# Patient Record
Sex: Female | Born: 1975 | Race: White | Hispanic: Yes | Marital: Married | Smoking: Current every day smoker
Health system: Southern US, Community
[De-identification: ages and names within clinical notes are randomized; demographics above are authoritative.]

## PROBLEM LIST (undated history)

## (undated) DIAGNOSIS — J45909 Unspecified asthma, uncomplicated: Secondary | ICD-10-CM

---

## 2007-03-13 ENCOUNTER — Emergency Department (HOSPITAL_COMMUNITY): Admission: EM | Admit: 2007-03-13 | Discharge: 2007-03-13 | Payer: Self-pay | Admitting: Emergency Medicine

## 2008-02-28 ENCOUNTER — Emergency Department (HOSPITAL_COMMUNITY): Admission: EM | Admit: 2008-02-28 | Discharge: 2008-02-28 | Payer: Self-pay | Admitting: Emergency Medicine

## 2010-05-12 ENCOUNTER — Emergency Department (HOSPITAL_COMMUNITY): Admission: EM | Admit: 2010-05-12 | Discharge: 2010-05-12 | Payer: Self-pay | Admitting: Emergency Medicine

## 2011-05-16 ENCOUNTER — Inpatient Hospital Stay (INDEPENDENT_AMBULATORY_CARE_PROVIDER_SITE_OTHER)
Admission: RE | Admit: 2011-05-16 | Discharge: 2011-05-16 | Disposition: A | Discharge status: Home / Self Care | Payer: Self-pay | Source: Ambulatory Visit | Attending: Family Medicine | Admitting: Family Medicine

## 2011-05-16 ENCOUNTER — Ambulatory Visit (INDEPENDENT_AMBULATORY_CARE_PROVIDER_SITE_OTHER): Payer: Self-pay

## 2011-05-16 DIAGNOSIS — R071 Chest pain on breathing: Secondary | ICD-10-CM

## 2011-05-16 DIAGNOSIS — R197 Diarrhea, unspecified: Secondary | ICD-10-CM

## 2011-05-16 LAB — POCT URINALYSIS DIP (DEVICE)
Ketones, ur: NEGATIVE mg/dL
Leukocytes, UA: NEGATIVE
Nitrite: NEGATIVE
Protein, ur: 30 mg/dL — AB
Urobilinogen, UA: 0.2 mg/dL (ref 0.0–1.0)
pH: 7 (ref 5.0–8.0)

## 2011-12-01 ENCOUNTER — Emergency Department (INDEPENDENT_AMBULATORY_CARE_PROVIDER_SITE_OTHER)
Admission: EM | Admit: 2011-12-01 | Discharge: 2011-12-01 | Disposition: A | Discharge status: Home / Self Care | Payer: Self-pay | Source: Home / Self Care | Attending: Emergency Medicine | Admitting: Emergency Medicine

## 2011-12-01 ENCOUNTER — Encounter (HOSPITAL_COMMUNITY): Payer: Self-pay

## 2011-12-01 DIAGNOSIS — K047 Periapical abscess without sinus: Secondary | ICD-10-CM

## 2011-12-01 DIAGNOSIS — K044 Acute apical periodontitis of pulpal origin: Secondary | ICD-10-CM

## 2011-12-01 MED ORDER — LIDOCAINE VISCOUS 2 % MT SOLN: 10.0000 mL | OROMUCOSAL | Status: AC | PRN

## 2011-12-01 MED ORDER — PENICILLIN V POTASSIUM 500 MG PO TABS: 500.0000 mg | ORAL_TABLET | Freq: Four times a day (QID) | ORAL | Status: AC

## 2011-12-01 MED ORDER — IBUPROFEN 600 MG PO TABS: 600.0000 mg | ORAL_TABLET | Freq: Four times a day (QID) | ORAL | Status: AC | PRN

## 2011-12-01 MED ORDER — CHLORHEXIDINE GLUCONATE 0.12 % MT SOLN: OROMUCOSAL | Status: AC

## 2011-12-01 MED ORDER — HYDROCODONE-ACETAMINOPHEN 5-325 MG PO TABS: ORAL_TABLET | ORAL | Status: AC

## 2011-12-01 NOTE — ED Notes (Signed)
States she has bad teeth upper area and needs rot canals; pain and swelling since Friday, worse since this AM; no relief w OTC

## 2011-12-01 NOTE — ED Provider Notes (Addendum)
History     CSN: [id number]  Arrival date & time 12/01/11  0930   First MD Initiated Contact with Patient 12/01/11 1117      Chief Complaint  Patient presents with  . Facial Swelling    (Consider location/radiation/quality/duration/timing/severity/associated sxs/prior treatment) HPI Comments: Patient with 2 days of left upper dental pain. States she has been told that she needs a root canal in the past, but cannot afford to have this done. Has been doing salt water gargles, taking 600 mg of ibuprofen 3 times a day with temporary relief. Notes left-sided facial swellings, nasal congestion, left-sided headache starting today. no nausea, vomiting, fevers. Does not recall any recent oral trauma  Patient is a 36 y.o. female presenting with tooth pain.  Dental PainThe primary symptoms include mouth pain. The symptoms began 2 days ago. The symptoms are worsening. The symptoms occur constantly.  Additional symptoms include: dental sensitivity to temperature, gum swelling, gum tenderness and facial swelling. Additional symptoms do not include: purulent gums, trismus, jaw pain, trouble swallowing, pain with swallowing, drooling, ear pain and swollen glands. Medical issues include: smoking and periodontal disease.    History reviewed. No pertinent past medical history.  History reviewed. No pertinent past surgical history.  History reviewed. No pertinent family history.  History  Substance Use Topics  . Smoking status: Current Everyday Smoker  . Smokeless tobacco: Not on file  . Alcohol Use: No    OB History    Grav Para Term Preterm Abortions TAB SAB Ect Mult Living                  Review of Systems  HENT: Positive for facial swelling. Negative for ear pain, drooling and trouble swallowing.     Allergies  Review of patient's allergies indicates no known allergies.  Home Medications   Current Outpatient Rx  Name Route Sig Dispense Refill  . CHLORHEXIDINE GLUCONATE 0.12 % MT  SOLN  15 mL swish and spit bid 120 mL 0  . HYDROCODONE-ACETAMINOPHEN 5-325 MG PO TABS  1-2 tabs q 6hr prn pain 20 tablet 0  . IBUPROFEN 600 MG PO TABS Oral Take 1 tablet (600 mg total) by mouth every 6 (six) hours as needed for pain. 30 tablet 0  . LIDOCAINE VISCOUS 2 % MT SOLN Oral Take 10 mLs by mouth as needed for pain. Hold in mouth and spit. Do not swallow. 100 mL 0  . PENICILLIN V POTASSIUM 500 MG PO TABS Oral Take 1 tablet (500 mg total) by mouth 4 (four) times daily. X 10 days 40 tablet 0    BP 107/62  Pulse 64  Temp(Src) 98.4 F (36.9 C) (Oral)  Resp 14  SpO2 100%  LMP 11/23/2011  Physical Exam  Nursing note and vitals reviewed. Constitutional: She is oriented to person, place, and time. She appears well-developed and well-nourished. No distress.       Appears uncomfortable  HENT:  Head: Normocephalic and atraumatic. No trismus in the jaw.  Mouth/Throat: Dental caries present.         Red areas= missing teeth with roots exposed. Widespread dental decay. Mild left-sided facial swelling. No loss of nasolabial fold site. No facial redness. No nasal congestion.   Eyes: Conjunctivae and EOM are normal.  Neck: Normal range of motion.  Cardiovascular: Normal rate.   Pulmonary/Chest: Effort normal.  Abdominal: She exhibits no distension.  Musculoskeletal: Normal range of motion.  Neurological: She is alert and oriented to person, place, and time.  Skin: Skin is warm and dry.  Psychiatric: She has a normal mood and affect. Her behavior is normal. Judgment and thought content normal.    ED Course  Procedures (including critical care time)  Labs Reviewed - No data to display No results found.   1. Dental infection      MDM  1120 went to evaluate pt, pt not in room.   Patient with early gingival abscess. Nothing to I&D at this time. No evidence of periapical abscess, or canine space abscess at this time. Sending home with antibiotics, chlorhexidine mouthwash, pain  medication. No evidence of Advised needs followup with open fracture or dentistry for the next 48 hours.  Luiz Blare, MD 12/01/11 1217  Luiz Blare, MD 12/01/11 [phone]

## 2014-04-21 ENCOUNTER — Emergency Department (INDEPENDENT_AMBULATORY_CARE_PROVIDER_SITE_OTHER)
Admission: EM | Admit: 2014-04-21 | Discharge: 2014-04-21 | Disposition: A | Discharge status: Home / Self Care | Payer: Self-pay | Source: Home / Self Care

## 2014-04-21 ENCOUNTER — Emergency Department (HOSPITAL_COMMUNITY): Payer: Self-pay

## 2014-04-21 ENCOUNTER — Encounter (HOSPITAL_COMMUNITY): Payer: Self-pay | Admitting: Emergency Medicine

## 2014-04-21 ENCOUNTER — Emergency Department (HOSPITAL_COMMUNITY)
Admission: EM | Admit: 2014-04-21 | Discharge: 2014-04-21 | Disposition: A | Discharge status: Home / Self Care | Payer: Self-pay | Attending: Emergency Medicine | Admitting: Emergency Medicine

## 2014-04-21 DIAGNOSIS — R0609 Other forms of dyspnea: Secondary | ICD-10-CM

## 2014-04-21 DIAGNOSIS — R079 Chest pain, unspecified: Secondary | ICD-10-CM

## 2014-04-21 DIAGNOSIS — R0600 Dyspnea, unspecified: Secondary | ICD-10-CM

## 2014-04-21 DIAGNOSIS — R0989 Other specified symptoms and signs involving the circulatory and respiratory systems: Secondary | ICD-10-CM

## 2014-04-21 DIAGNOSIS — J45909 Unspecified asthma, uncomplicated: Secondary | ICD-10-CM | POA: Insufficient documentation

## 2014-04-21 DIAGNOSIS — R002 Palpitations: Secondary | ICD-10-CM

## 2014-04-21 DIAGNOSIS — R1011 Right upper quadrant pain: Secondary | ICD-10-CM | POA: Insufficient documentation

## 2014-04-21 DIAGNOSIS — F17200 Nicotine dependence, unspecified, uncomplicated: Secondary | ICD-10-CM | POA: Insufficient documentation

## 2014-04-21 HISTORY — DX: Unspecified asthma, uncomplicated: J45.909

## 2014-04-21 LAB — CBC
HCT: 40.7 % (ref 36.0–46.0)
HEMOGLOBIN: 13.6 g/dL (ref 12.0–15.0)
MCH: 27.2 pg (ref 26.0–34.0)
MCHC: 33.4 g/dL (ref 30.0–36.0)
MCV: 81.4 fL (ref 78.0–100.0)
PLATELETS: 268 10*3/uL (ref 150–400)
RBC: 5 MIL/uL (ref 3.87–5.11)
RDW: 13.2 % (ref 11.5–15.5)
WBC: 11.3 10*3/uL — ABNORMAL HIGH (ref 4.0–10.5)

## 2014-04-21 LAB — HEPATIC FUNCTION PANEL
ALBUMIN: 3.9 g/dL (ref 3.5–5.2)
ALK PHOS: 57 U/L (ref 39–117)
ALT: 15 U/L (ref 0–35)
AST: 18 U/L (ref 0–37)
Bilirubin, Direct: <0.2 mg/dL (ref 0.0–0.3)
TOTAL PROTEIN: 6.7 g/dL (ref 6.0–8.3)
Total Bilirubin: <0.2 mg/dL — ABNORMAL LOW (ref 0.3–1.2)

## 2014-04-21 LAB — BASIC METABOLIC PANEL
ANION GAP: 11 (ref 5–15)
BUN: 7 mg/dL (ref 6–23)
CHLORIDE: 105 meq/L (ref 96–112)
CO2: 24 mEq/L (ref 19–32)
CREATININE: 0.74 mg/dL (ref 0.50–1.10)
Calcium: 8.7 mg/dL (ref 8.4–10.5)
Glucose, Bld: 88 mg/dL (ref 70–99)
POTASSIUM: 5 meq/L (ref 3.7–5.3)
Sodium: 140 mEq/L (ref 137–147)

## 2014-04-21 LAB — I-STAT TROPONIN, ED
TROPONIN I, POC: 0 ng/mL (ref 0.00–0.08)
TROPONIN I, POC: 0 ng/mL (ref 0.00–0.08)

## 2014-04-21 LAB — D-DIMER, QUANTITATIVE: D-Dimer, Quant: <0.27 ug/mL-FEU (ref 0.00–0.48)

## 2014-04-21 LAB — LIPASE, BLOOD: LIPASE: 25 U/L (ref 11–59)

## 2014-04-21 MED ORDER — ASPIRIN 81 MG PO CHEW: 324.0000 mg | CHEWABLE_TABLET | Freq: Once | ORAL | Status: DC

## 2014-04-21 MED ORDER — ASPIRIN 81 MG PO CHEW
CHEWABLE_TABLET | ORAL | Status: AC
  Filled 2014-04-21: qty 4

## 2014-04-21 MED ORDER — HYDROCODONE-ACETAMINOPHEN 5-325 MG PO TABS: 1.0000 | ORAL_TABLET | ORAL | Status: AC | PRN

## 2014-04-21 MED ORDER — SODIUM CHLORIDE 0.9 % IV SOLN
Freq: Once | INTRAVENOUS | Status: AC
  Administered 2014-04-21: 18:00:00 via INTRAVENOUS

## 2014-04-21 MED ORDER — HYDROCODONE-ACETAMINOPHEN 5-325 MG PO TABS
1.0000 | ORAL_TABLET | Freq: Once | ORAL | Status: AC
  Administered 2014-04-21: 1 via ORAL
  Filled 2014-04-21: qty 1

## 2014-04-21 NOTE — ED Notes (Signed)
Pt c/o sob and chest pain since yesterday.  States that my heart is racing off and on.  Feels anxious. States no hx of anxiety or panic attacks.   Symptoms present since yesterday

## 2014-04-21 NOTE — ED Provider Notes (Signed)
CSN: [id number]     Arrival date & time 04/21/14  1718 History   First MD Initiated Contact with Patient 04/21/14 1745     Chief Complaint  Patient presents with  . Shortness of Breath  . Chest Pain   (Consider location/radiation/quality/duration/timing/severity/associated sxs/prior Treatment) HPI Comments: 38 year old female with a greater than 20 pack year history of smoking presents with a complaint of anterior mid and left chest pain, rapid heart rate, palpitations, dyspnea , shaking and weakness. She had an episode yesterday morning and then again today. She describes the anterior chest pain as heaviness on her chest. Sometimes the pain radiates to the left shoulder. She states at times she is unable to take a deep breath and get enough air. On arrival she is in no acute distress, appears mildly anxious but no observed respiratory distress. She is speaking in complete sentences with normal content. Family history of sudden death due to pulmonary embolus and MI in younger than 39 years old.   Past Medical History  Diagnosis Date  . Asthma    History reviewed. No pertinent past surgical history. History reviewed. No pertinent family history. History  Substance Use Topics  . Smoking status: Current Every Day Smoker  . Smokeless tobacco: Not on file  . Alcohol Use: No   OB History   Grav Para Term Preterm Abortions TAB SAB Ect Mult Living                 Review of Systems  Constitutional: Positive for activity change. Negative for fever and fatigue.  HENT: Negative.   Respiratory: Positive for chest tightness and shortness of breath. Negative for wheezing.   Cardiovascular: Positive for chest pain and palpitations. Negative for leg swelling.  Gastrointestinal: Negative.   Genitourinary: Negative.   Skin: Negative.   Neurological: Negative for dizziness, syncope, facial asymmetry, speech difficulty, numbness and headaches.    Allergies  Review of patient's allergies  indicates no known allergies.  Home Medications   Prior to Admission medications   Not on File   BP 128/88  Pulse 76  Temp(Src) 98.6 F (37 C) (Oral)  Resp 16  SpO2 100%  LMP 04/07/2014 Physical Exam  Nursing note and vitals reviewed. Constitutional: She is oriented to person, place, and time. She appears well-developed and well-nourished. No distress.  Neck: Normal range of motion. Neck supple.  Cardiovascular: Normal rate, regular rhythm, normal heart sounds and intact distal pulses.   No murmur heard. Pulmonary/Chest: Effort normal and breath sounds normal.  Diminished breath sounds on expiration and prolonged expiratory phase. No crackles or wheezing heard.  Abdominal: Soft. There is no tenderness.  Musculoskeletal: She exhibits no edema and no tenderness.  Lymphadenopathy:    She has no cervical adenopathy.  Neurological: She is alert and oriented to person, place, and time.  Skin: Skin is warm and dry.  Psychiatric: She has a normal mood and affect.    ED Course  Procedures (including critical care time) Labs Review Labs Reviewed - No data to display  Imaging Review No results found. EKG: Normal sinus rhythm. No ectopy. No ST-T changes.  MDM   1. Chest pain, unspecified chest pain type   2. Dyspnea   3. Heart palpitations     [facility] for evaluation of chest pain, dyspnea and sensation of rapid heart rate and palpitations. The patient is a smoker with a family history of early heart disease and pulmonary embolism in ages less than 39  years old.     Hayden Rasmussenavid Loui Massenburg, NP 04/21/14 [phone]

## 2014-04-21 NOTE — Discharge Instructions (Signed)

## 2014-04-21 NOTE — ED Notes (Signed)
Per [facility] pt c/o 7/10 central-left chest heaviness x1 day. And SOB. Has hx of asthma. Has had 324 ASA.

## 2014-04-21 NOTE — ED Notes (Signed)
EDP at bedside  

## 2014-04-21 NOTE — ED Provider Notes (Signed)
CSN: [id number]     Arrival date & time 04/21/14  1847 History   First MD Initiated Contact with Patient 04/21/14 1856     Chief Complaint  Patient presents with  . Chest Pain     (Consider location/radiation/quality/duration/timing/severity/associated sxs/prior Treatment) HPI 38 year old female with chest pain since yesterday. Worse with certain positions. Pleuritic. Also RUQ abdominal pain during this time. Pain seems to come and go. Has had palpitations during this time as well. No dizziness. No vomiting. No hx of DVT/PE but has family history of clots and family is being worked up for familial clotting disorders.   Past Medical History  Diagnosis Date  . Asthma    History reviewed. No pertinent past surgical history. No family history on file. History  Substance Use Topics  . Smoking status: Current Every Day Smoker  . Smokeless tobacco: Not on file  . Alcohol Use: No   OB History   Grav Para Term Preterm Abortions TAB SAB Ect Mult Living                 Review of Systems  Constitutional: Negative for fever.  Respiratory: Positive for shortness of breath.   Cardiovascular: Positive for chest pain.  Gastrointestinal: Positive for abdominal pain. Negative for vomiting.  Neurological: Negative for dizziness and weakness.  All other systems reviewed and are negative.     Allergies  Review of patient's allergies indicates no known allergies.  Home Medications   Prior to Admission medications   Medication Sig Start Date End Date Taking? Authorizing Provider  ibuprofen (ADVIL,MOTRIN) 200 MG tablet Take 200 mg by mouth every 6 (six) hours as needed.   Yes Historical Provider, MD   BP 108/59  Pulse 60  Temp(Src) 98 F (36.7 C) (Oral)  Resp 16  Ht 5\' 3"  (1.6 m)  Wt 135 lb (61.236 kg)  BMI 23.92 kg/m2  SpO2 100%  LMP 04/07/2014 Physical Exam  Nursing note and vitals reviewed. Constitutional: She is oriented to person, place, and time. She appears well-developed  and well-nourished. No distress.  HENT:  Head: Normocephalic and atraumatic.  Right Ear: External ear normal.  Left Ear: External ear normal.  Nose: Nose normal.  Eyes: Right eye exhibits no discharge. Left eye exhibits no discharge.  Cardiovascular: Normal rate, regular rhythm and normal heart sounds.   Pulmonary/Chest: Effort normal and breath sounds normal. She exhibits tenderness.  Abdominal: Soft. There is tenderness (mild) in the right upper quadrant.  Musculoskeletal: She exhibits no edema.  Neurological: She is alert and oriented to person, place, and time.  Skin: Skin is warm and dry.    ED Course  Procedures (including critical care time) Labs Review Labs Reviewed  CBC - Abnormal; Notable for the following:    WBC 11.3 (*)    All other components within normal limits  HEPATIC FUNCTION PANEL - Abnormal; Notable for the following:    Total Bilirubin <0.2 (*)    All other components within normal limits  BASIC METABOLIC PANEL  LIPASE, BLOOD  D-DIMER, QUANTITATIVE  I-STAT TROPOININ, ED  Rosezena SensorI-STAT TROPOININ, ED    Imaging Review Dg Chest 2 View  04/21/2014   CLINICAL DATA:  Shortness of breath, chest pain  EXAM: CHEST  2 VIEW  COMPARISON:  None.  FINDINGS: The heart size and mediastinal contours are within normal limits. Both lungs are clear. The visualized skeletal structures are unremarkable.  IMPRESSION: No active cardiopulmonary disease.   Electronically Signed   By: Caryn BeeKevin  Dover M.D.   On: 04/21/2014 20:24   [place] Abdomen Limited  04/21/2014   CLINICAL DATA:  Right upper quadrant abdominal pain. Patient ate crackers approximately 2 hr prior to the examination.  EXAM: US ABDOMEN LIMITED - RIGHT UPPER QUADRANT  COMPARISON:  None.  FINDINGS: Gallbladder:  Mildly contracted consistent with recent meal. No shadowing gallstones or echogenic sludge. No gallbladder wall thickening or pericholecystic fluid. Negative sonographic Murphy sign according to the ultrasound technologist.   Common bile duct:  Diameter: Approximately 2 mm.  Liver:  Normal size and echotexture without focal parenchymal abnormality. Patent portal vein with hepatopetal flow.  IMPRESSION: No significant abnormality. Gallbladder mildly contracted as expected 2 hr after a meal.   Electronically Signed   By: Hulan Saashomas  Lawrence M.D.   On: 04/21/2014 20:06     EKG Interpretation   Date/Time:  Friday April 21 2014 18:53:01 EDT Ventricular Rate:  63 PR Interval:  185 QRS Duration: 69 QT Interval:  380 QTC Calculation: 389 R Axis:   72 Text Interpretation:  Sinus rhythm Anterior infarct, old No old tracing to  compare Confirmed by Jaleen Grupp  MD, Shakendra Griffeth ([id number]) on 04/21/2014 6:58:24 PM      MDM   Final diagnoses:  Chest pain, unspecified chest pain type    Patient's workup is unremakable. Is low risk for PE, has not signs of of DVT. Negative ddimer. EKG shows no ischemia. Troponin negative. HEART score is low, feel she is stable for outpatient workup. Recommended 3 hour troponin but patient has to leave early so settled on 2 hour troponin. More likely this is chest wall or abdominal in etiology given her exam. Discussed strict return precautions, and will give cardiology f/u.    Audree CamelScott T Jamicheal Heard, MD 04/22/14 [phone]

## 2014-04-22 NOTE — ED Provider Notes (Signed)
Medical screening examination/treatment/procedure(s) were performed by non-physician practitioner and as supervising physician I was immediately available for consultation/collaboration.  Refujio Haymer, M.D.  Altonio Schwertner C Adara Kittle, MD 04/22/14 0833 

## 2016-02-17 IMAGING — CR DG CHEST 2V
2 series · 2 of 2 positions shown · non-contrast
Comparison: None.

CLINICAL DATA: Shortness of breath, chest pain

EXAM:
CHEST  2 VIEW

[w chest pa]
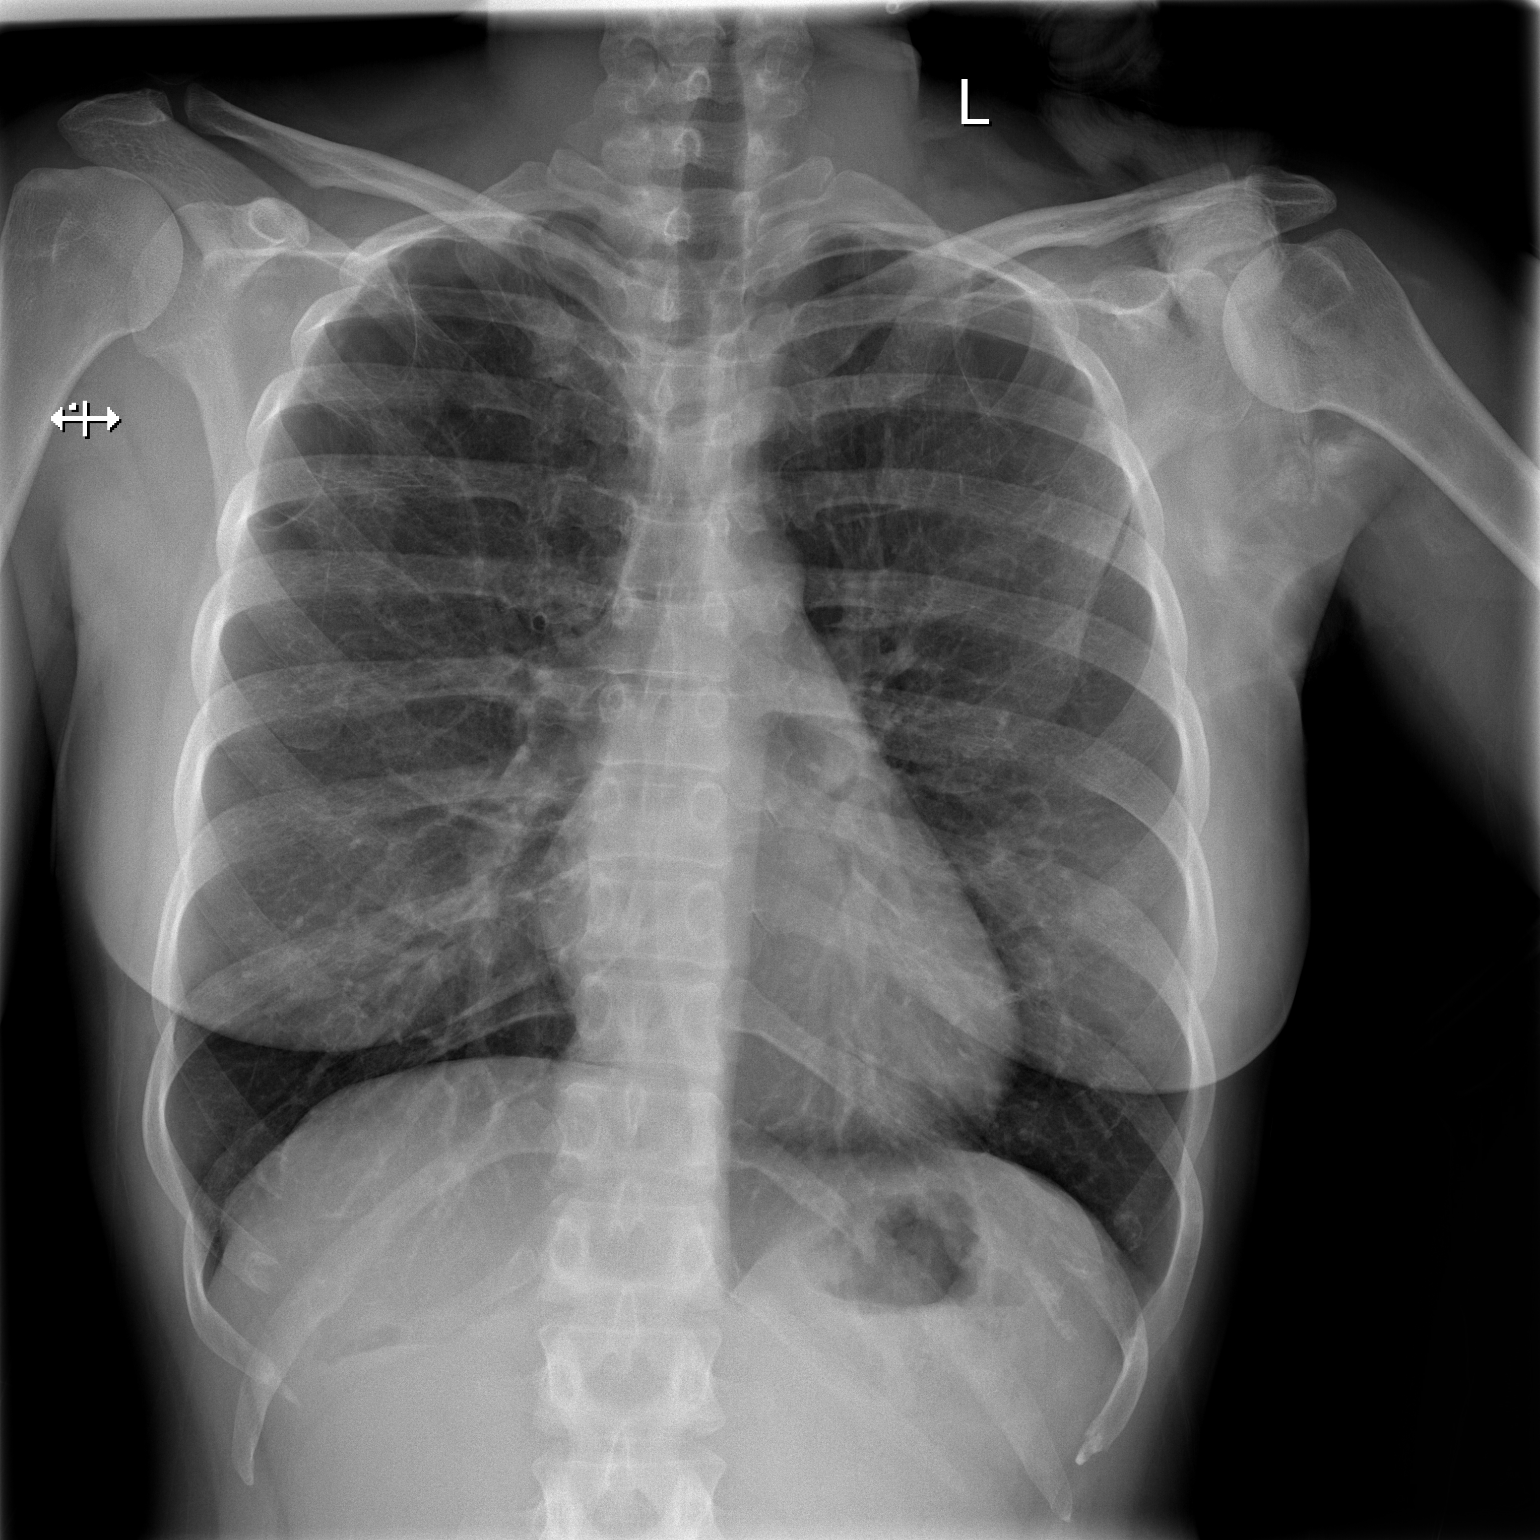

[w chest lat]
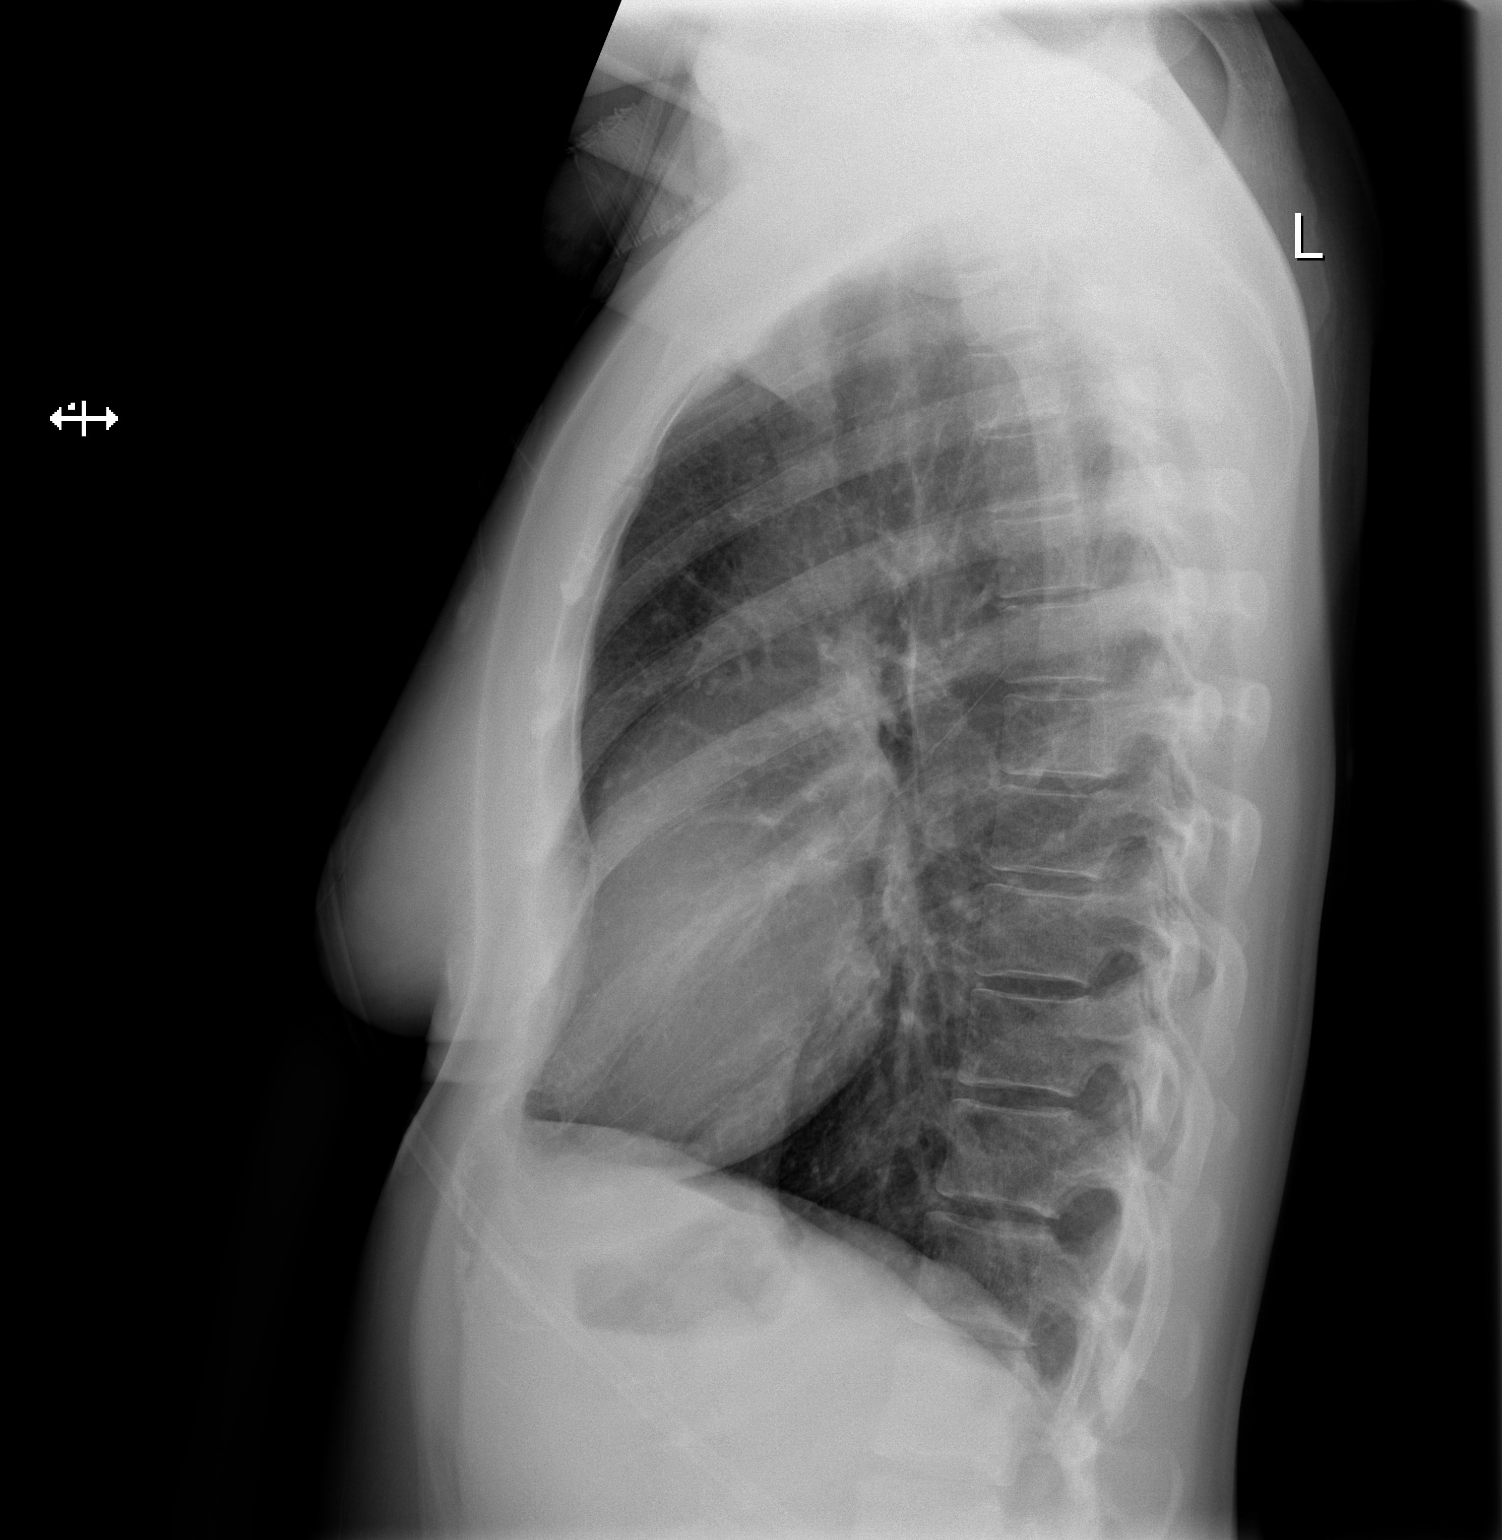

[2 of 2 positions shown; findings below may reference images not displayed]

FINDINGS: The heart size and mediastinal contours are within normal limits.
Both lungs are clear. The visualized skeletal structures are
unremarkable.
IMPRESSION: No active cardiopulmonary disease.

## 2016-02-17 IMAGING — US US ABDOMEN LIMITED
1 series · 14 of 25 positions shown · non-contrast
Comparison: None.

CLINICAL DATA: Right upper quadrant abdominal pain. Patient ate
crackers approximately 2 hr prior to the examination.

EXAM:
US ABDOMEN LIMITED - RIGHT UPPER QUADRANT

[Series 1: us abdomen limited · 0.27mm/px · 14 of 44 slices shown]
[im 1/44]
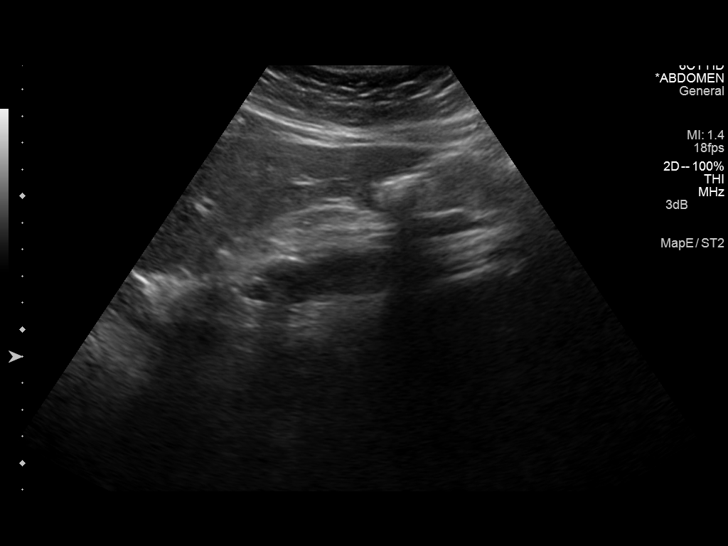
[im 4/44]
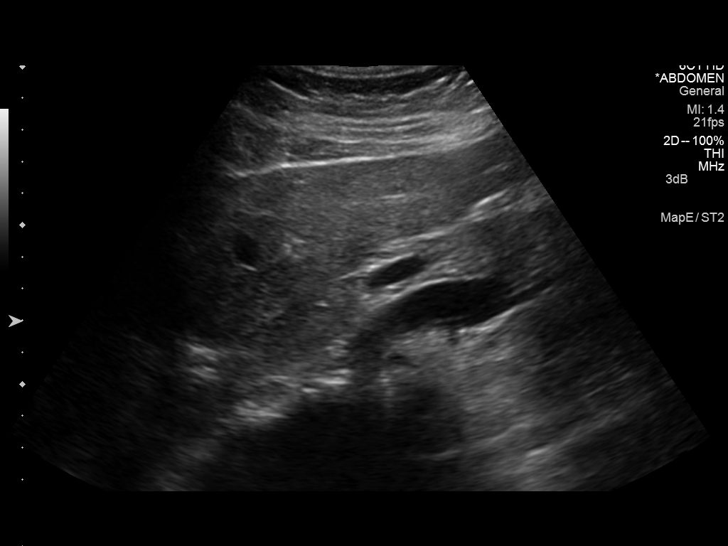
[im 8/44]
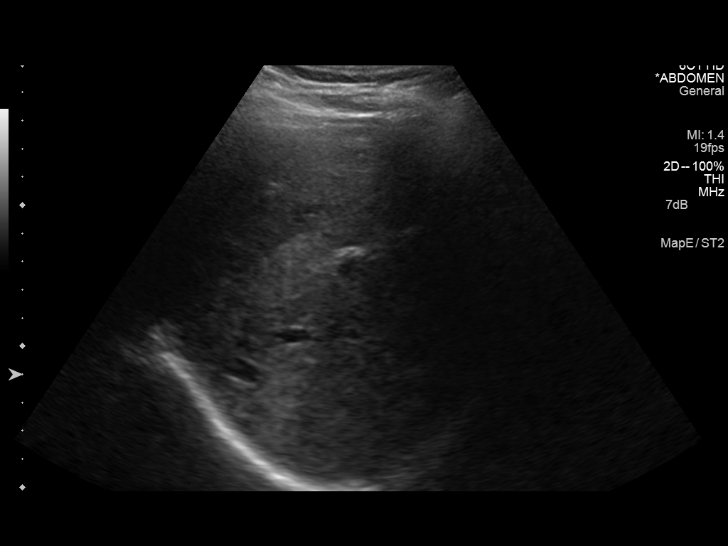
[im 11/44]
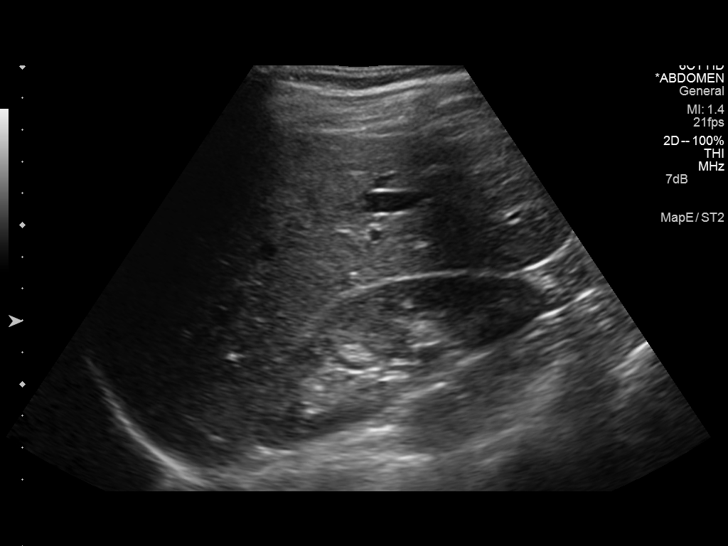
[im 15/44]
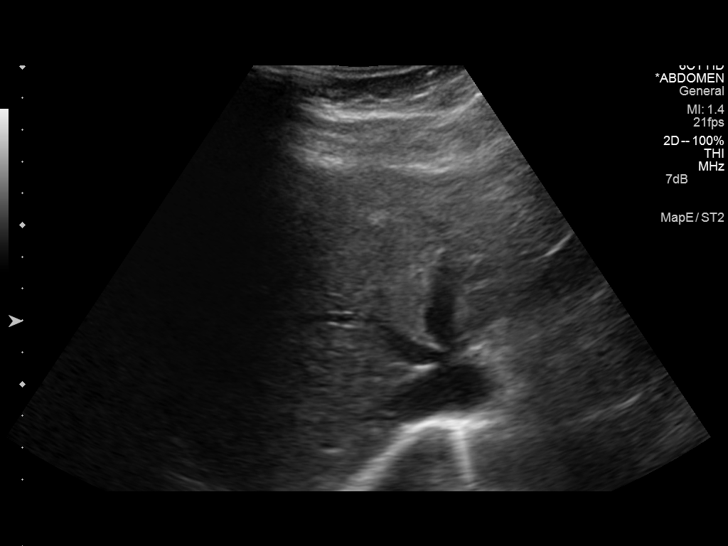
[im 17/44]
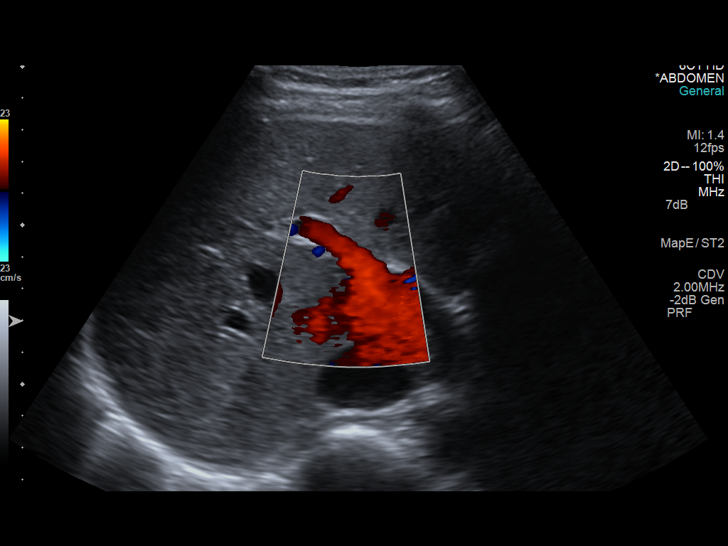
[im 20/44]
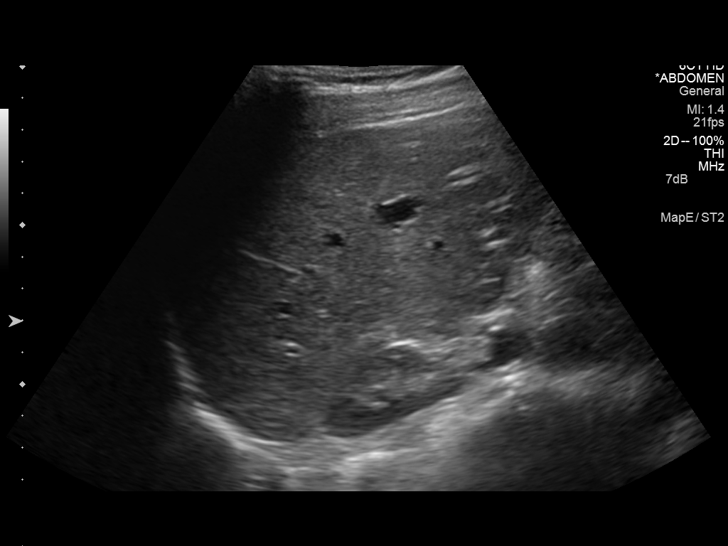
[im 24/44]
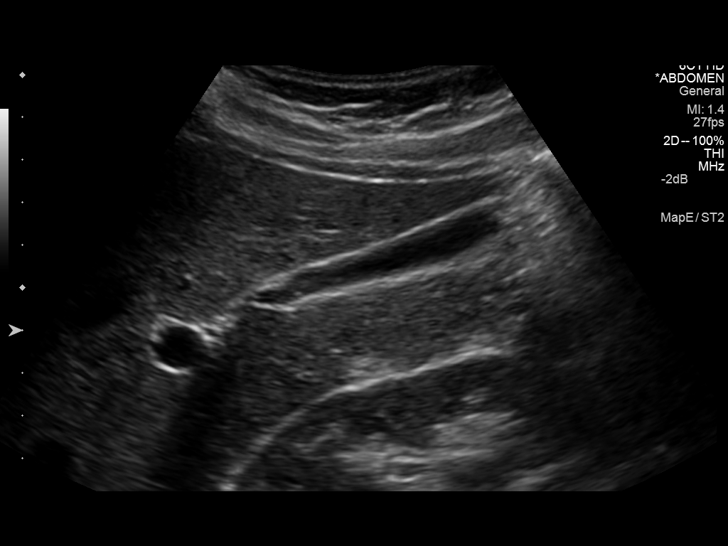
[im 27/44]
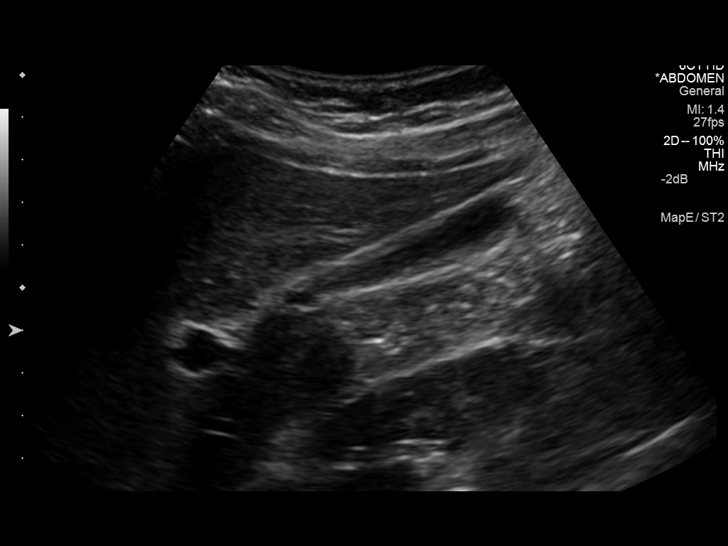
[im 29/44]
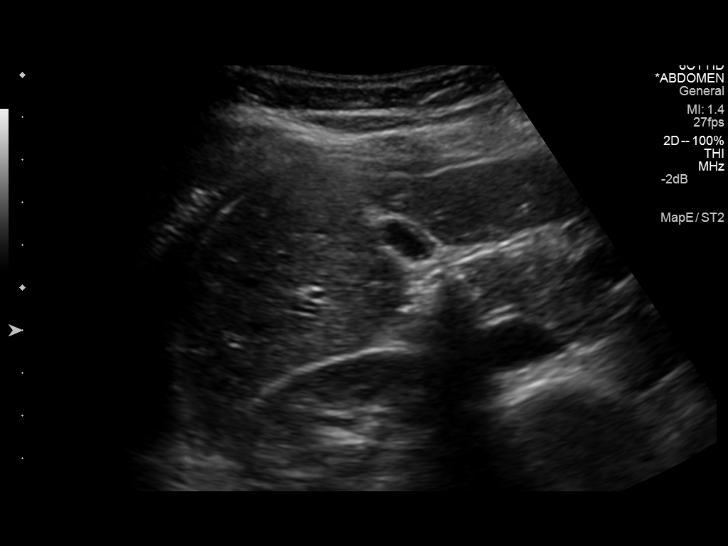
[im 33/44]
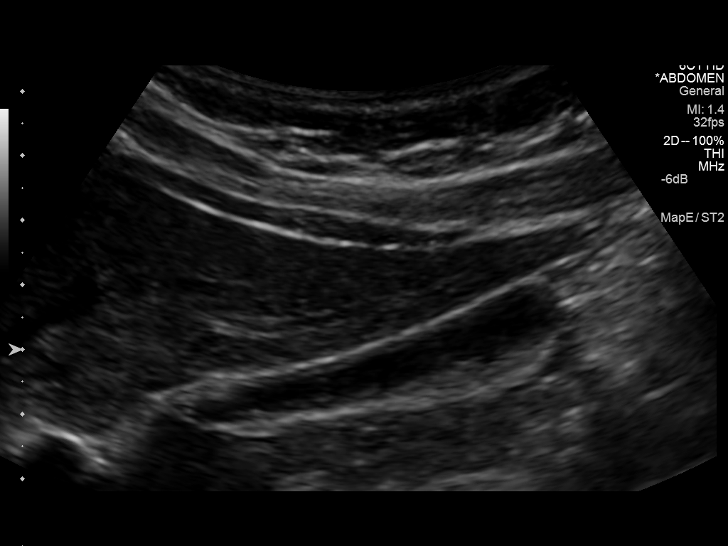
[im 36/44]
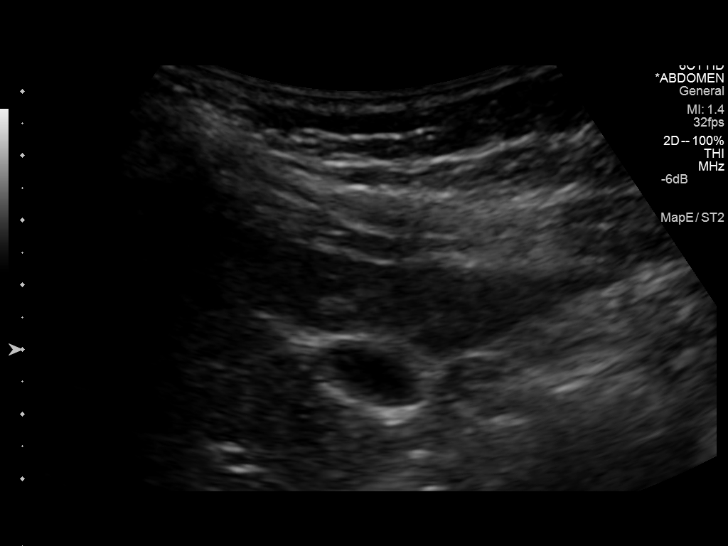
[im 40/44]
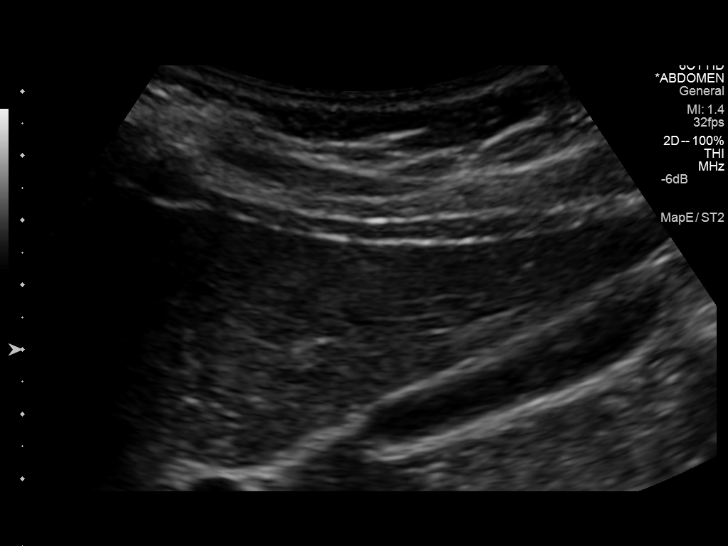
[im 44/44]
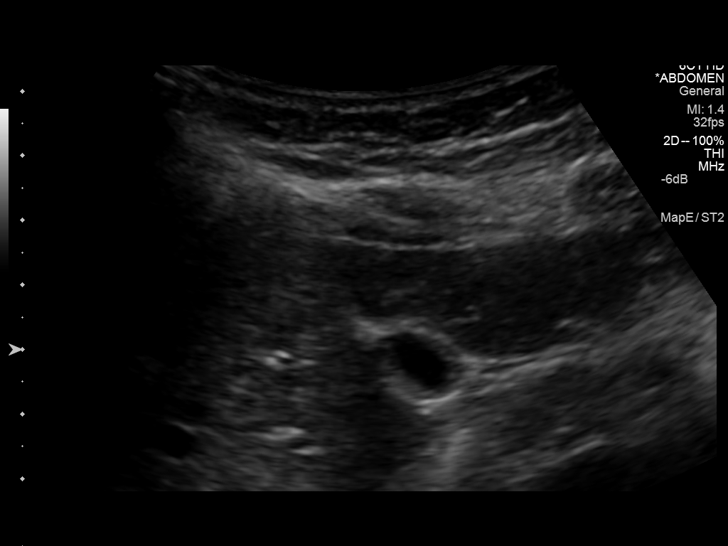

[14 of 25 positions shown; findings below may reference images not displayed]

FINDINGS: Gallbladder:

Mildly contracted consistent with recent meal. No shadowing
gallstones or echogenic sludge. No gallbladder wall thickening or
pericholecystic fluid. Negative sonographic Murphy sign according to
the ultrasound technologist.

Common bile duct:

Diameter: Approximately 2 mm.

Liver:

Normal size and echotexture without focal parenchymal abnormality.
Patent portal vein with hepatopetal flow.
IMPRESSION: No significant abnormality. Gallbladder mildly contracted as
expected 2 hr after a meal.
